# Patient Record
Sex: Female | Born: 1935 | Race: White | Hispanic: No
Health system: Southern US, Community
[De-identification: ages and names within clinical notes are randomized; demographics above are authoritative.]

## PROBLEM LIST (undated history)

## (undated) DIAGNOSIS — C801 Malignant (primary) neoplasm, unspecified: Secondary | ICD-10-CM

## (undated) DIAGNOSIS — I10 Essential (primary) hypertension: Secondary | ICD-10-CM

## (undated) HISTORY — PX: OTHER SURGICAL HISTORY: SHX169

## (undated) HISTORY — PX: CARDIAC CATHETERIZATION: SHX172

---

## 1998-10-10 ENCOUNTER — Ambulatory Visit (HOSPITAL_COMMUNITY): Admission: RE | Admit: 1998-10-10 | Discharge: 1998-10-10 | Payer: Self-pay | Admitting: Cardiology

## 2000-11-19 ENCOUNTER — Other Ambulatory Visit: Admission: RE | Admit: 2000-11-19 | Discharge: 2000-11-19 | Payer: Self-pay | Admitting: Dermatology

## 2000-12-21 ENCOUNTER — Other Ambulatory Visit: Admission: RE | Admit: 2000-12-21 | Discharge: 2000-12-21 | Payer: Self-pay | Admitting: Dermatology

## 2001-11-18 ENCOUNTER — Other Ambulatory Visit: Admission: RE | Admit: 2001-11-18 | Discharge: 2001-11-18 | Payer: Self-pay | Admitting: Dermatology

## 2015-09-23 ENCOUNTER — Encounter (HOSPITAL_COMMUNITY): Payer: Self-pay | Admitting: *Deleted

## 2015-09-23 ENCOUNTER — Emergency Department (HOSPITAL_COMMUNITY): Payer: Medicare Other

## 2015-09-23 ENCOUNTER — Inpatient Hospital Stay (HOSPITAL_COMMUNITY)
Admission: EM | Admit: 2015-09-23 | Discharge: 2015-09-25 | DRG: 308 | Disposition: A | Discharge status: Hospice | Payer: Medicare Other | Attending: Oncology | Admitting: Oncology

## 2015-09-23 ENCOUNTER — Encounter: Payer: Self-pay | Admitting: Cardiology

## 2015-09-23 DIAGNOSIS — R0602 Shortness of breath: Secondary | ICD-10-CM | POA: Diagnosis present

## 2015-09-23 DIAGNOSIS — R0989 Other specified symptoms and signs involving the circulatory and respiratory systems: Secondary | ICD-10-CM

## 2015-09-23 DIAGNOSIS — I5030 Unspecified diastolic (congestive) heart failure: Secondary | ICD-10-CM

## 2015-09-23 DIAGNOSIS — R079 Chest pain, unspecified: Secondary | ICD-10-CM

## 2015-09-23 DIAGNOSIS — I110 Hypertensive heart disease with heart failure: Secondary | ICD-10-CM | POA: Diagnosis not present

## 2015-09-23 DIAGNOSIS — C4491 Basal cell carcinoma of skin, unspecified: Secondary | ICD-10-CM | POA: Diagnosis not present

## 2015-09-23 DIAGNOSIS — C4490 Unspecified malignant neoplasm of skin, unspecified: Secondary | ICD-10-CM | POA: Diagnosis not present

## 2015-09-23 DIAGNOSIS — R9431 Abnormal electrocardiogram [ECG] [EKG]: Secondary | ICD-10-CM | POA: Diagnosis present

## 2015-09-23 DIAGNOSIS — Z7982 Long term (current) use of aspirin: Secondary | ICD-10-CM | POA: Diagnosis not present

## 2015-09-23 DIAGNOSIS — I25119 Atherosclerotic heart disease of native coronary artery with unspecified angina pectoris: Secondary | ICD-10-CM | POA: Diagnosis not present

## 2015-09-23 DIAGNOSIS — I160 Hypertensive urgency: Secondary | ICD-10-CM

## 2015-09-23 DIAGNOSIS — Z66 Do not resuscitate: Secondary | ICD-10-CM | POA: Diagnosis not present

## 2015-09-23 DIAGNOSIS — Z7189 Other specified counseling: Secondary | ICD-10-CM

## 2015-09-23 DIAGNOSIS — I5033 Acute on chronic diastolic (congestive) heart failure: Secondary | ICD-10-CM | POA: Diagnosis not present

## 2015-09-23 DIAGNOSIS — Z91041 Radiographic dye allergy status: Secondary | ICD-10-CM

## 2015-09-23 DIAGNOSIS — Z79899 Other long term (current) drug therapy: Secondary | ICD-10-CM | POA: Diagnosis not present

## 2015-09-23 DIAGNOSIS — Z515 Encounter for palliative care: Secondary | ICD-10-CM | POA: Diagnosis present

## 2015-09-23 DIAGNOSIS — C6962 Malignant neoplasm of left orbit: Secondary | ICD-10-CM | POA: Diagnosis not present

## 2015-09-23 DIAGNOSIS — E785 Hyperlipidemia, unspecified: Secondary | ICD-10-CM | POA: Diagnosis not present

## 2015-09-23 DIAGNOSIS — I249 Acute ischemic heart disease, unspecified: Secondary | ICD-10-CM | POA: Diagnosis present

## 2015-09-23 DIAGNOSIS — M810 Age-related osteoporosis without current pathological fracture: Secondary | ICD-10-CM | POA: Diagnosis not present

## 2015-09-23 DIAGNOSIS — I447 Left bundle-branch block, unspecified: Secondary | ICD-10-CM | POA: Diagnosis not present

## 2015-09-23 DIAGNOSIS — Z87891 Personal history of nicotine dependence: Secondary | ICD-10-CM | POA: Diagnosis not present

## 2015-09-23 DIAGNOSIS — J439 Emphysema, unspecified: Secondary | ICD-10-CM | POA: Diagnosis present

## 2015-09-23 DIAGNOSIS — I213 ST elevation (STEMI) myocardial infarction of unspecified site: Secondary | ICD-10-CM | POA: Diagnosis not present

## 2015-09-23 DIAGNOSIS — Z79810 Long term (current) use of selective estrogen receptor modulators (SERMs): Secondary | ICD-10-CM | POA: Diagnosis not present

## 2015-09-23 DIAGNOSIS — K219 Gastro-esophageal reflux disease without esophagitis: Secondary | ICD-10-CM | POA: Diagnosis present

## 2015-09-23 DIAGNOSIS — R069 Unspecified abnormalities of breathing: Secondary | ICD-10-CM | POA: Diagnosis not present

## 2015-09-23 DIAGNOSIS — I509 Heart failure, unspecified: Secondary | ICD-10-CM

## 2015-09-23 HISTORY — DX: Essential (primary) hypertension: I10

## 2015-09-23 HISTORY — DX: Malignant (primary) neoplasm, unspecified: C80.1

## 2015-09-23 LAB — COMPREHENSIVE METABOLIC PANEL
ALBUMIN: 4 g/dL (ref 3.5–5.0)
ALK PHOS: 66 U/L (ref 38–126)
ALT: 16 U/L (ref 14–54)
ANION GAP: 13 (ref 5–15)
AST: 22 U/L (ref 15–41)
BUN: 10 mg/dL (ref 6–20)
CALCIUM: 9.2 mg/dL (ref 8.9–10.3)
CO2: 22 mmol/L (ref 22–32)
Chloride: 101 mmol/L (ref 101–111)
Creatinine, Ser: 0.87 mg/dL (ref 0.44–1.00)
GFR calc non Af Amer: 57 mL/min — ABNORMAL LOW (ref >60)
GLUCOSE: 157 mg/dL — AB (ref 65–99)
POTASSIUM: 3.9 mmol/L (ref 3.5–5.1)
SODIUM: 136 mmol/L (ref 135–145)
Total Bilirubin: 0.6 mg/dL (ref 0.3–1.2)
Total Protein: 7.9 g/dL (ref 6.5–8.1)

## 2015-09-23 LAB — URINE MICROSCOPIC-ADD ON

## 2015-09-23 LAB — TROPONIN I
TROPONIN I: 0.04 ng/mL — AB (ref <0.03)
Troponin I: 0.03 ng/mL (ref <0.03)
Troponin I: 0.04 ng/mL (ref <0.03)

## 2015-09-23 LAB — URINALYSIS, ROUTINE W REFLEX MICROSCOPIC
BILIRUBIN URINE: NEGATIVE
Glucose, UA: NEGATIVE mg/dL
HGB URINE DIPSTICK: NEGATIVE
Ketones, ur: NEGATIVE mg/dL
Leukocytes, UA: NEGATIVE
NITRITE: NEGATIVE
PROTEIN: 30 mg/dL — AB
SPECIFIC GRAVITY, URINE: 1.014 (ref 1.005–1.030)
pH: 7 (ref 5.0–8.0)

## 2015-09-23 LAB — CBC WITH DIFFERENTIAL/PLATELET
BASOS PCT: 0 %
Basophils Absolute: 0.1 10*3/uL (ref 0.0–0.1)
EOS ABS: 0.2 10*3/uL (ref 0.0–0.7)
EOS PCT: 1 %
HCT: 41.3 % (ref 36.0–46.0)
HEMOGLOBIN: 13.1 g/dL (ref 12.0–15.0)
LYMPHS ABS: 4.5 10*3/uL — AB (ref 0.7–4.0)
Lymphocytes Relative: 25 %
MCH: 25.8 pg — AB (ref 26.0–34.0)
MCHC: 31.7 g/dL (ref 30.0–36.0)
MCV: 81.3 fL (ref 78.0–100.0)
MONO ABS: 1.3 10*3/uL — AB (ref 0.1–1.0)
MONOS PCT: 7 %
NEUTROS PCT: 67 %
Neutro Abs: 11.7 10*3/uL — ABNORMAL HIGH (ref 1.7–7.7)
PLATELETS: 371 10*3/uL (ref 150–400)
RBC: 5.08 MIL/uL (ref 3.87–5.11)
RDW: 15.4 % (ref 11.5–15.5)
WBC: 17.7 10*3/uL — ABNORMAL HIGH (ref 4.0–10.5)

## 2015-09-23 LAB — CBG MONITORING, ED: Glucose-Capillary: 158 mg/dL — ABNORMAL HIGH (ref 65–99)

## 2015-09-23 LAB — MRSA PCR SCREENING: MRSA by PCR: NEGATIVE

## 2015-09-23 LAB — BRAIN NATRIURETIC PEPTIDE: B NATRIURETIC PEPTIDE 5: 482.9 pg/mL — AB (ref 0.0–100.0)

## 2015-09-23 LAB — I-STAT TROPONIN, ED: TROPONIN I, POC: 0.01 ng/mL (ref 0.00–0.08)

## 2015-09-23 MED ORDER — NITROGLYCERIN 0.4 MG SL SUBL: 0.4000 mg | SUBLINGUAL_TABLET | SUBLINGUAL | Status: DC | PRN

## 2015-09-23 MED ORDER — ALPRAZOLAM 0.25 MG PO TABS
0.2500 mg | ORAL_TABLET | Freq: Every evening | ORAL | Status: DC | PRN
Start: 2015-09-23 — End: 2015-09-25
  Administered 2015-09-23: 0.25 mg via ORAL

## 2015-09-23 MED ORDER — ASPIRIN 81 MG PO CHEW
324.0000 mg | CHEWABLE_TABLET | Freq: Once | ORAL | Status: AC
  Administered 2015-09-23: 324 mg via ORAL
  Filled 2015-09-23: qty 4

## 2015-09-23 MED ORDER — ONDANSETRON HCL 4 MG/2ML IJ SOLN
4.0000 mg | Freq: Four times a day (QID) | INTRAMUSCULAR | Status: DC | PRN
  Administered 2015-09-24 – 2015-09-25 (×2): 4 mg via INTRAVENOUS
  Filled 2015-09-23 (×2): qty 2

## 2015-09-23 MED ORDER — HEPARIN BOLUS VIA INFUSION: 4000.0000 [IU] | Freq: Once | INTRAVENOUS | Status: DC

## 2015-09-23 MED ORDER — LABETALOL HCL 5 MG/ML IV SOLN
2.0000 mg/min | INTRAVENOUS | Status: DC
  Administered 2015-09-23: 2 mg/min via INTRAVENOUS
  Filled 2015-09-23: qty 100

## 2015-09-23 MED ORDER — NITROGLYCERIN IN D5W 200-5 MCG/ML-% IV SOLN
0.0000 ug/min | INTRAVENOUS | Status: DC
  Administered 2015-09-23: 50 ug/min via INTRAVENOUS
  Administered 2015-09-24: 60 ug/min via INTRAVENOUS
  Filled 2015-09-23 (×2): qty 250

## 2015-09-23 MED ORDER — HEPARIN (PORCINE) IN NACL 100-0.45 UNIT/ML-% IJ SOLN
700.0000 [IU]/h | INTRAMUSCULAR | Status: DC
  Administered 2015-09-23: 700 [IU]/h via INTRAVENOUS
  Filled 2015-09-23 (×2): qty 250

## 2015-09-23 MED ORDER — DIPHENHYDRAMINE HCL 25 MG PO CAPS
50.0000 mg | ORAL_CAPSULE | Freq: Every day | ORAL | Status: DC
  Administered 2015-09-23 – 2015-09-24 (×2): 50 mg via ORAL
  Filled 2015-09-23 (×2): qty 2

## 2015-09-23 MED ORDER — METOPROLOL TARTRATE 5 MG/5ML IV SOLN
5.0000 mg | Freq: Once | INTRAVENOUS | Status: AC
  Administered 2015-09-23: 5 mg via INTRAVENOUS
  Filled 2015-09-23: qty 5

## 2015-09-23 MED ORDER — ASPIRIN EC 81 MG PO TBEC
81.0000 mg | DELAYED_RELEASE_TABLET | Freq: Every day | ORAL | Status: DC
  Administered 2015-09-24 – 2015-09-25 (×2): 81 mg via ORAL
  Filled 2015-09-23 (×2): qty 1

## 2015-09-23 MED ORDER — PANTOPRAZOLE SODIUM 40 MG PO TBEC
40.0000 mg | DELAYED_RELEASE_TABLET | Freq: Every day | ORAL | Status: DC
  Administered 2015-09-23 – 2015-09-25 (×3): 40 mg via ORAL
  Filled 2015-09-23 (×3): qty 1

## 2015-09-23 MED ORDER — HEPARIN SODIUM (PORCINE) 5000 UNIT/ML IJ SOLN: 4000.0000 [IU] | Freq: Once | INTRAMUSCULAR | Status: DC

## 2015-09-23 MED ORDER — FUROSEMIDE 10 MG/ML IJ SOLN: 40.0000 mg | Freq: Once | INTRAMUSCULAR | Status: DC

## 2015-09-23 MED ORDER — CITALOPRAM HYDROBROMIDE 20 MG PO TABS
20.0000 mg | ORAL_TABLET | Freq: Every day | ORAL | Status: DC
  Administered 2015-09-23 – 2015-09-25 (×3): 20 mg via ORAL
  Filled 2015-09-23 (×3): qty 1

## 2015-09-23 MED ORDER — HEPARIN SODIUM (PORCINE) 5000 UNIT/ML IJ SOLN
INTRAMUSCULAR | Status: AC
  Administered 2015-09-23: 4000 [IU]
  Filled 2015-09-23: qty 1

## 2015-09-23 MED ORDER — ACETAMINOPHEN 325 MG PO TABS
650.0000 mg | ORAL_TABLET | ORAL | Status: DC | PRN
  Administered 2015-09-23 – 2015-09-24 (×4): 650 mg via ORAL
  Filled 2015-09-23 (×5): qty 2

## 2015-09-23 NOTE — H&P (Signed)
Date: 09/23/2015               Patient Name:  Sandra Friedman MRN: [id number]  DOB: Sep 19, 1923 Age / Sex: 82 y.o., female   PCP: Caryl Bis, MD         Medical Service: Internal Medicine Teaching Service         Attending Physician: Dr. Julianne Rice, MD    First Contact: Dr. Holley Raring Pager: [phone]  Second Contact: Dr. Charlott Rakes Pager: [phone]       After Hours (After 5p/  First Contact Pager: [phone]  weekends / holidays): Second Contact Pager: [phone]   Chief Complaint: SOB / Chest pressure  History of Present Illness: Ms. Sandra Friedman is a 82 y.o. female with a h/o of emphysema/asthma, CAD, HTN, osteoporosis who presents with acute onset of SOB and chest pressure. Symptoms began abruptly this morning. She reports that she got out of bed to use the restroom and was SOB, when she got back in bed she began to have significant chest pressure, substernal, non-radiating, denies pain. EMS was called and she presented to the ED w/ EKG significant for STEMI.  Pt endorses h/o similar symptoms 1 yr ago, which resolved spontaneously, and for which she was never evaluated. She also notes chronic SOB and fatigue which she previously attributed to her emphysema. She also endorses a h/o cardiac catheterization many years ago after which she had cardiac arrest. She notes that at that time she was found to be allergic to the contrast dye.  Cardiology was consulted in the ED and catheterization was not recommended d/t high risk. This determination is in line w/ the pt and family's GoC. Pt is DNR w/ comfort priority.  She was also noted to have inoperable cancer of the left eye which has had paroxysmal bleeding. She has some minor discomfort from this eye, but has not been evaluated by her Ophthalmologist is several years.  Meds: Current Facility-Administered Medications  Medication Dose Route Frequency Provider Last Rate Last Dose  . heparin injection 4,000 Units  4,000 Units  Intravenous Once Para March, [place] at 09/23/15 1208   Current Outpatient Prescriptions  Medication Sig Dispense Refill  . aspirin EC 81 MG tablet Take 81 mg by mouth daily.    . citalopram (CELEXA) 20 MG tablet Take 20 mg by mouth daily.    . cloNIDine (CATAPRES) 0.3 MG tablet Take 0.3 mg by mouth 2 (two) times daily.    . diphenhydrAMINE (BENADRYL) 25 mg capsule Take 50 mg by mouth at bedtime.    Marland Kitchen ibuprofen (ADVIL,MOTRIN) 200 MG tablet Take 400 mg by mouth at bedtime.    Marland Kitchen losartan (COZAAR) 100 MG tablet Take 100 mg by mouth daily.    Marland Kitchen omeprazole (PRILOSEC) 20 MG capsule Take 20 mg by mouth 2 (two) times daily.    . raloxifene (EVISTA) 60 MG tablet Take 60 mg by mouth daily.      Allergies: Allergies as of 09/23/2015 - Review Complete 09/23/2015  Allergen Reaction Noted  . Sulfa antibiotics Anaphylaxis 09/23/2015  . Ivp dye [iodinated diagnostic agents] Other (See Comments)   . Penicillins Other (See Comments) 09/23/2015   Past Medical History:  Diagnosis Date  . Cancer [facility])    Left Eye -- deemed inoperable (apparently she refused surgery prior to this point). Has had a history of bleeding in the eye. Was being [facility] at [facility]. But unable to  obtain records from care everywhere.  . Hypertension     Family History: No significant family hx per pt and son/daughter-in-law  Social History: Retired Regulatory affairs officer. Lives with son and daughter-in-law. 27yr h/o 3 cig/day tobacco use, no alcohol, no drugs.  Review of Systems: A complete ROS was negative except as per HPI. Review of Systems  Constitutional: Positive for malaise/fatigue. Negative for chills, diaphoresis, fever and weight loss.  Eyes: Positive for pain (OS), discharge (OS) and redness (OS). Negative for blurred vision.       Pt w/ invasive periorbital mass of left eye, no sight, some pain  Respiratory: Positive for shortness of breath. Negative for cough.   Cardiovascular: Positive  for chest pain. Negative for leg swelling.  Gastrointestinal: Negative for abdominal pain, constipation, diarrhea, nausea and vomiting.  Genitourinary: Negative for dysuria, frequency and urgency.  Musculoskeletal: Negative for falls and myalgias.  Skin: Negative for rash.  Neurological: Negative for dizziness, tremors and headaches.  Endo/Heme/Allergies: Negative for polydipsia.  Psychiatric/Behavioral: The patient is not nervous/anxious.     Physical Exam: Vitals:   09/23/15 1345 09/23/15 1445 09/23/15 1446 09/23/15 1500  BP: 179/83 189/78  191/72  Pulse: 74 76  66  Resp: 19 17  13   Temp:      TempSrc:      SpO2: 99% 100%  100%  Height:   4\' 11"  (1.499 m)    Physical Exam  Constitutional: She is oriented to person, place, and time. She appears well-developed. She is cooperative. No distress.  HENT:  Head: Normocephalic and atraumatic.  Right Ear: Hearing normal.  Left Ear: Hearing normal.  Nose: Nose normal.  Mouth/Throat: Mucous membranes are normal.  Cardiovascular: Normal rate, regular rhythm, S1 normal, S2 normal and intact distal pulses.  Exam reveals no gallop.   No murmur heard. Pulmonary/Chest: She is in respiratory distress (mild). She has no wheezes. She has no rhonchi. She has no rales. She exhibits no tenderness. Breasts are symmetrical.  Abdominal: Soft. Normal appearance and bowel sounds are normal. She exhibits no ascites. There is no hepatosplenomegaly. There is no tenderness. There is no CVA tenderness.  Musculoskeletal: Normal range of motion. She exhibits no edema.  Neurological: She is alert and oriented to person, place, and time. She has normal strength.  Skin: Skin is warm, dry and intact. She is not diaphoretic.  Psychiatric: She has a normal mood and affect. Her speech is normal and behavior is normal.    Labs: CBC:  Recent Labs Lab 09/23/15 1124  WBC 17.7*  NEUTROABS 11.7*  HGB 13.1  HCT 41.3  MCV 81.3  PLT 123456   Basic Metabolic  Panel:  Recent Labs Lab 09/23/15 1124  NA 136  K 3.9  CL 101  CO2 22  GLUCOSE 157*  BUN 10  CREATININE 0.87  CALCIUM 9.2   Cardiac Enzymes:  Recent Labs Lab 09/23/15 1124 09/23/15 1131  TROPONINI 0.03*  --   TROPIPOC  --  0.01   BNP (last 3 results)  Recent Labs  09/23/15 1124  BNP 482.9*   Liver Function Tests:  Recent Labs Lab 09/23/15 1124  AST 22  ALT 16  ALKPHOS 66  BILITOT 0.6  PROT 7.9  ALBUMIN 4.0   Recent Labs Lab 09/23/15 1124  GLUCAP 158*   Urinalysis    Component Value Date/Time   COLORURINE YELLOW 09/23/2015 1210   APPEARANCEUR CLEAR 09/23/2015 1210   LABSPEC 1.014 09/23/2015 1210   PHURINE 7.0 09/23/2015 1210   GLUCOSEU NEGATIVE  09/23/2015 1210   HGBUR NEGATIVE 09/23/2015 1210   BILIRUBINUR NEGATIVE 09/23/2015 1210   KETONESUR NEGATIVE 09/23/2015 1210   PROTEINUR 30 (A) 09/23/2015 1210   NITRITE NEGATIVE 09/23/2015 1210   LEUKOCYTESUR NEGATIVE 09/23/2015 1210   EKG: EKG Interpretation  Date/Time:    Ventricular Rate:    PR Interval:    QRS Duration:   QT Interval:    QTC Calculation:   R Axis:     Text Interpretation:    Imaging: Dg Chest Port 1 View  Result Date: 09/23/2015 CLINICAL DATA:  Shortness of breath. EXAM: PORTABLE CHEST 1 VIEW COMPARISON:  None. FINDINGS: The heart is mildly enlarged. There is no evidence of pulmonary edema, consolidation, pneumothorax, nodule or pleural fluid. Visualized bony structures are unremarkable. IMPRESSION: Mild cardiac enlargement.  No active disease. Electronically Signed   By: Aletta Edouard M.D.   On: 09/23/2015 12:09   Assessment & Plan by Problem: Principal Problem:   Shortness of breath at rest Active Problems:   ST-segment elevation on initial EKG, then converted to LBBB   LBBB (left bundle branch block)   Encounter for discussion of do not resuscitate order   ACS (acute coronary syndrome) [facility])  Ms. CYSTAL GAVAN is a 82 y.o. female with h/o emphysema/asthma, CAD,  HTN, osteoporosis who presents w/ acute onset CP and SOB found to have ST elevation on EKG.  1) CP: Pt w/ h/o CAD. Possible ACS w/ ST elevation on EKG. Trop 0.03. Hypertensive. Refuses cath. GoC comfort only. - admit to stepdown w/ tele - Cardiology c/s: appreciate recs  - 72 hr IV heparin ACS protocol (monitor for bleeding from left eye)  - Nitro gtt  - Echo  - consider IV Lasix, will hold pending echo - trend Trops x 2 (@1730  & 2330) - CBC/BMP in AM - cont ASA - monitor HTN  2) Leukocytosis: possible reactive in setting of ACS. WBC (17.7) w/ temp of 99.77F. SOB w/ CXR w/o evidence of infiltrate. UA w/ few bacteria no other signs of UTI, asymptomatic. - continue to monitor  3) HTN: BP elevated on admission to 170/100. On home meds of losartan 100mg  qD, clonidine 0.3 BID. Received IV labetalol in ED w/ some improvement in BP, but HR down to the 50s. - Nitro gtt - Per Cards, titrate beta blocker plus or minus ARB for blood pressure control while weaning nitro  DVT PPx - heparin  Code Status - DNR (comfort only)  Consults Placed - Cardiology  Dispo: Admit patient to Obs  Signed: Holley Raring, MD 09/23/2015, 2:18 PM  Pager: [phone]

## 2015-09-23 NOTE — Consult Note (Signed)
Interventional Cardiology Consult    Patient ID: Sandra Friedman MRN: [id number], DOB/AGE: September 15, 1923   Admit date: 09/23/2015 Date of Consult: 09/23/2015  Primary Physician: No primary care provider on file. Primary Cardiologist: No prior cardiologist. Last heart catheterization was in the 1980s Requesting Provider: Dr. Lita Mains, [place]  Patient Profile    82 year old woman with a history of hypertension and cancer involving left eye that was deemed inoperable at [facility]. She has not seen her PCP in over 3 years. She presents as a code STEMI evaluation by [facility] after awakening with acute onset dyspnea. EKG from the field showed possible anterior ST elevations with inferior and lateral depressions.  Past Medical History   Past Medical History:  Diagnosis Date  . Cancer [facility])    Left Eye -- deemed inoperable (apparently she refused surgery prior to this point). Has had a history of bleeding in the eye. Was being followed at [facility]. But unable to obtain records from care everywhere.  . Hypertension     Past Surgical History:  Procedure Laterality Date  . CARDIAC CATHETERIZATION  1980s   She barely had 2 catheterizations, one at [facility] and 1 at [facility]. The last catheterization was complicated by contrast and that caused her to "black out". She was told that she has allergy to contrast  . gerd    . hyperlipidemia       Allergies  Allergies  Allergen Reactions  . Sulfa Antibiotics Anaphylaxis  . Ivp Dye [Iodinated Diagnostic Agents]   . Penicillins Other (See Comments)    History of Present Illness    Sandra Friedman is a very pleasant elderly woman who noted being in her relative routine state of health until early this morning at about 8:00 when she awoke with sudden onset shortness of breath with agonal breathing. EMS was contacted and her daughter who is a retired Marine scientist evaluate her as well. She was using rest accessory muscles  upon her evaluation. The patient stated that after being placed on nasal cannula oxygen, her breathing improved significantly. She did note having some substernal chest pressure and heaviness that also was relieved with oxygen. By the time she was being transported she was feeling "a whole lot better ", and is less short of breath now. She appears to be in no distress upon arrival. No longer having any chest pressure. She denies any rapid irregular heartbeats or palpitations. She states that she always feels a little warmth at the site of her cancer that is covered with a patch, this is chronic and nothing new. She denies any antecedent illness such as fevers chills cold sweats. She felt a little bit nauseated this morning but did not vomit. She has not had any blood in her stools or tarry stools or blood in her urine, but has had history of paroxysmal having some bleeding from the eye.  She denies any PND, orthopnea prior to this event but has noted some mild left foot swelling over last couple days  Cardiovascular ROS: positive for - chest pain, edema and shortness of breath negative for - dyspnea on exertion, irregular heartbeat, loss of consciousness, murmur, orthopnea, palpitations, paroxysmal nocturnal dyspnea, rapid heart rate or TIA/amaurosis fugax, syncope/near syncope   Inpatient Medications    . heparin  4,000 Units Intravenous Once    Family History    Family History  Problem Relation Age of Onset  . Family history unknown: Yes  - The patient and  her daughter Daughter were unable to provide any significant family history.  Social History    Social History   Social History  . Marital status: Married    Spouse name: N/A  . Number of children: N/A  . Years of education: N/A   Occupational History  . Not on file.   Social History Main Topics  . Smoking status: Unknown If Ever Smoked  . Smokeless tobacco: Not on file  . Alcohol use No  . Drug use: No  . Sexual activity:  Not on file   Other Topics Concern  . Not on file   Social History Narrative  . No narrative on file     Review of Systems    General:  No chills, fever, night sweats or weight changes.  Cardiovascular:  + chest pressure & new onset dyspnea this AM (not before), No prior dyspnea on exertion, edema, orthopnea, palpitations, paroxysmal nocturnal dyspnea. Dermatological: No rash, lesions/masses;; Cancer involving L eye - wears agauze patch. Large scab lesion on R forehead Respiratory: No cough, dyspnea Urologic: No hematuria, dysuria Abdominal:   No nausea, vomiting, diarrhea, bright red blood per rectum, melena, or hematemesis Neurologic:  No visual changes, wkns, changes in mental status. All other systems reviewed and are otherwise negative except as noted above.  Physical Exam    Blood pressure (!) 231/95, pulse 85, temperature 99.2 F (37.3 C), temperature source Oral, resp. rate 17, SpO2 100 %.  General: Pleasant,Normal mood and affect. Actually appears to be in no acute distress. States that her breathing is much improved. Psych: Normal affect. A little bit tearful because she is in the hospital, but is in sound mind Neuro: Alert and oriented X 3. Moves all extremities spontaneously.; Unable to assess left eye, but right eye EOMI. Otherwise CN II-XII grossly intact HEENT: Very poor dentition. Cancer involving L eye - wears agauze patch - has mild crusted plaque coming down from below the patch on the left nose.. Large scab lesion on R forehead Neck: Supple without bruits; mildly elevated JVD with mild HJR Lungs:  Resp regular and unlabored, CTAB with may be fine bibasilar crackles. Heart: Borderline tachycardia. Normal S1 with split S2. No obvious murmurs, rubs or gallops. Abdomen: Soft, non-tender, non-distended, BS + x 4. (Last BM was this morning) Extremities: No clubbing, cyanosis or edema. DP/PT/Radials 2+ and equal bilaterally.  Labs    Troponin [facility]  Test)  Recent Labs  09/23/15 1131  TROPIPOC 0.01    Recent Labs  09/23/15 1124  TROPONINI 0.03*   Lab Results  Component Value Date   WBC 17.7 (H) 09/23/2015   HGB 13.1 09/23/2015   HCT 41.3 09/23/2015   MCV 81.3 09/23/2015   PLT 371 09/23/2015     Recent Labs Lab 09/23/15 1124  NA 136  K 3.9  CL 101  CO2 22  BUN 10  CREATININE 0.87  CALCIUM 9.2  PROT 7.9  BILITOT 0.6  ALKPHOS 66  ALT 16  AST 22  GLUCOSE 157*   No results found for: CHOL, HDL, LDLCALC, TRIG No results found for: [facility]   Radiology Studies    Dg Chest Port 1 View  Result Date: 09/23/2015 CLINICAL DATA:  Shortness of breath. EXAM: PORTABLE CHEST 1 VIEW COMPARISON:  None. FINDINGS: The heart is mildly enlarged. There is no evidence of pulmonary edema, consolidation, pneumothorax, nodule or pleural fluid. Visualized bony structures are unremarkable. IMPRESSION: Mild cardiac enlargement.  No active disease. Electronically Signed  By: Aletta Edouard M.D.   On: 09/23/2015 12:09    ECG & Cardiac Imaging     EKG by EMS shows sinus rhythm heart rate 93 BPM. Undetermined rhythm, but appears to be sinus rhythm. There is right axis deviation of 121. Nonspecific IVCD with concerning ST elevations in leads V1-V3 with ST depressions in II, III, aVF as well as V6. His also mild ST elevation in aVR and aVL with Q waves in I and aVL.  EKG upon arrival to [facility]: What appears to be Sinus rhythm (likely 1 AVB), heart rate 95 bpm, QRS complexes now appeared to be left bundle branch block with either a barely conducted PACs versus PVCs. There are persistent ST depressions in II and ST elevation in aVR. Q waves no longer noted in I and aVL. Difficult to determine, but does not appear to meet [place]'s criteria for ST elevation in anterior leads.  Assessment & Plan    Principal Problem:   Shortness of breath at rest Active Problems:   ST-segment elevation on initial EKG, then converted to LBBB   LBBB  (left bundle branch block)   Encounter for discussion of do not resuscitate order   ACS (acute coronary syndrome) ([place])  I have seen and evaluated the patient in the emergency room. She is a very pleasant elderly woman who has inoperable cancer, and currently actually "feels well ". Her breathing is notably improved. Difficult to determine what has actually occurred, my suspicion based on the change in EKG is that this very well could be acute coronary syndrome related with what looked like possible anterior STEMI converting to Left Bundle Branch Block. This would be concerning for possible proximal CAD. In fact her out of hospital EKG would've suggested potential left main disease. She actually looks pretty good currently from a clinical standpoint, and is tolerating being profoundly hypertensive and somewhat tachycardic.  I had a long discussion with the patient and then her daughter in the emergency room. We discussed potential treatment options and evaluation options including acute transport to the cardiac catheterization lab for cardiac catheterization and possible PCI as well as primarily early noninvasive medical management with heparin hypertension control with nitrates and beta blocker as well as likely diuretic.  I provided her the risks, benefits and potential complications of both options as well as discussed concern for potential cardiac arrest in the setting of potential MI. After about a five-minute discussion the patient made it clear that she would not want to have a heart catheterization for several reasons: One being that she had previously had an near anaphylactic reaction in the 1980s from heart catheterization, the other reason being that she would not want invasive evaluation secondary to her advanced age, inoperable cancer, and previously documented advanced directive. She indicates that she would not want CPR or intubation were she to have cardiac arrest. Based on this  conversation, the patient is indicated that she would prefer to have medical management of her potential acute coronary syndrome without going to the cardiac catheterization lab.  Code STEMI was canceled. DO NOT RESUSCITATE/DO NOT INTUBATE order placed in the chart In the ER she was administered 4000 units of IV heparin, and we will plan IV heparin drip She was given IV beta blocker for blood pressure/heart rate.  When I completed my evaluation she is currently getting her chest x-ray performed and her initial CBC came back with an elevated white blood cell count. She is also noted to have a  mild low-grade fever of unsure importance.  After discussing with Dr. Lita Mains, we decided that the best course of action given her somewhat compensated history would be to ask [facility] hospitalist team to admit the patient with cardiology consultation. I have notified the general cardiology M.D. on call, Dr. Candee Furbish will be aware of the patient -however this counsel note will serve for initial cardiology consultation.   Recommendations:  72 hour IV heparin monitoring for any signs of bleeding especially from her eye.  Likely IV nitroglycerin infusion depending on her blood pressure looks like after the IV beta blocker.  IV Lasix pending further evaluation with chest x-ray and echocardiogram  Check 2-D echocardiogram  Titrate beta blocker plus or minus ARB for blood pressure control while weaning off nitroglycerin  Follow-up chest x-ray to ensure that her low-grade fever and white blood cell count is not related to potential pneumonia given her presentation with dyspnea.  Complete medical evaluation for other potential etiologies for her white count and fever. This could simply be related to ACS.  Would consider palliative care consultation if not simply just to discuss patient's goals of care.     Signed, Glenetta Hew, M.D., M.S. Interventional Cardiologist   Pager # [phone] Phone #  [phone] [address] [place], [place] [address]

## 2015-09-23 NOTE — ED Notes (Signed)
Report called and accepted

## 2015-09-23 NOTE — ED Notes (Signed)
Per Dr. Posey Pronto, we are holding off on the lasix for now until he puts in his final admitting orders

## 2015-09-23 NOTE — ED Notes (Signed)
Collected troponin and dropped off in the lab

## 2015-09-23 NOTE — ED Notes (Signed)
Cardiologist Selena Batten at bedside discussing options with pt

## 2015-09-23 NOTE — Progress Notes (Signed)
ANTICOAGULATION CONSULT NOTE - Initial Consult  Pharmacy Consult for heparin Indication: chest pain/ACS  Allergies  Allergen Reactions  . Sulfa Antibiotics Anaphylaxis  . Ivp Dye [Iodinated Diagnostic Agents] Other (See Comments)    Unknown  . Penicillins Other (See Comments)    Unknown    Patient Measurements: Height: 4\' 11"  (149.9 cm) IBW/kg (Calculated) : 43.2  Vital Signs: Temp: 99.2 F (37.3 C) (08/20 1129) Temp Source: Oral (08/20 1129) BP: 179/83 (08/20 1345) Pulse Rate: 74 (08/20 1345)  Labs:  Recent Labs  09/23/15 1124  HGB 13.1  HCT 41.3  PLT 371  CREATININE 0.87  TROPONINI 0.03*    CrCl cannot be calculated (Unknown ideal weight.).   Medical History: Past Medical History:  Diagnosis Date  . Cancer [facility])    Left Eye -- deemed inoperable (apparently she refused surgery prior to this point). Has had a history of bleeding in the eye. Was being followed at [facility]. But unable to obtain records from care everywhere.  . Hypertension     Medications:  Infusions:  . heparin    . nitroGLYCERIN      Assessment: 89 yof presented to the ED as a code stemi which was ultimately cancelled. She received a dose of IV heparin earlier today upon ED arrival, now starting a heparin gtt. Baseline H/H and platelets are WNL. She is not on anticoagulation PTA.   Goal of Therapy:  Heparin level 0.3-0.7 units/ml Monitor platelets by anticoagulation protocol: Yes   Plan:  - Heparin gtt 700 units/hr - Check an 8 hr heparin level - Daily heparin level and CBC  Sandra Friedman, Rande Lawman 09/23/2015,3:01 PM

## 2015-09-23 NOTE — ED Notes (Signed)
Attempted report to 2H 

## 2015-09-23 NOTE — ED Notes (Signed)
Code STEMI cancelled per cardiologist Ellyn Hack

## 2015-09-23 NOTE — ED Triage Notes (Signed)
Pt in from home c/o SOB onset this am, [facility] arrived to the home & administered O2 [place] & SOB subsided, pt c/o intermittent CP, pt reported to have ST elevation in leads 3 & 4, pt denies SOB, & CP upon arrival to ED, A&O x4

## 2015-09-23 NOTE — ED Provider Notes (Signed)
[facility] DEPT Provider Note   CSN: [id number] Arrival date & time: 09/23/15  1116     History   Chief Complaint Chief Complaint  Patient presents with  . Code STEMI    HPI Sandra Friedman is a 82 y.o. female.  HPI Patient presents with acute onset shortness of breath and mild chest pressure starting this morning. No fever or chills. No cough. Left bundle branch block with some concerning findings for ischemia on EKG done by EMS. Acute STEMI initiated. Patient states that her chest pressure is now completely resolved. She states her shortness of breath is improved with oxygen. She has had some lower extremity swelling but denies any pain. Patient has not followed regularly by a physician. She has an invasive skin cancer near the left eye per family. She's elected not to have treatment.  Past Medical History:  Diagnosis Date  . Cancer [facility])    Left Eye -- deemed inoperable (apparently she refused surgery prior to this point). Has had a history of bleeding in the eye. Was being followed at [facility]. But unable to obtain records from care everywhere.  . Hypertension     Patient Active Problem List   Diagnosis Date Noted  . ST-segment elevation on initial EKG, then converted to LBBB 09/23/2015    Class: Hospitalized for  . LBBB (left bundle branch block) 09/23/2015  . Shortness of breath at rest 09/23/2015  . Encounter for discussion of do not resuscitate order 09/23/2015  . ACS (acute coronary syndrome) ([place]) 09/23/2015    Past Surgical History:  Procedure Laterality Date  . CARDIAC CATHETERIZATION  1980s   She barely had 2 catheterizations, one at [facility] and 1 at [facility]. The last catheterization was complicated by contrast and that caused her to "black out". She was told that she has allergy to contrast  . gerd    . hyperlipidemia      OB History    No data available       Home Medications    Prior to Admission medications     Medication Sig Start Date End Date Taking? Authorizing Provider  aspirin EC 81 MG tablet Take 81 mg by mouth daily.   Yes Historical Provider, MD  citalopram (CELEXA) 20 MG tablet Take 20 mg by mouth daily.   Yes Historical Provider, MD  cloNIDine (CATAPRES) 0.3 MG tablet Take 0.3 mg by mouth 2 (two) times daily.   Yes Historical Provider, MD  diphenhydrAMINE (BENADRYL) 25 mg capsule Take 50 mg by mouth at bedtime.   Yes Historical Provider, MD  ibuprofen (ADVIL,MOTRIN) 200 MG tablet Take 400 mg by mouth at bedtime.   Yes Historical Provider, MD  losartan (COZAAR) 100 MG tablet Take 100 mg by mouth daily.   Yes Historical Provider, MD  omeprazole (PRILOSEC) 20 MG capsule Take 20 mg by mouth 2 (two) times daily.   Yes Historical Provider, MD  raloxifene (EVISTA) 60 MG tablet Take 60 mg by mouth daily.   Yes Historical Provider, MD    Family History Family History  Problem Relation Age of Onset  . Family history unknown: Yes    Social History Social History  Substance Use Topics  . Smoking status: Unknown If Ever Smoked  . Smokeless tobacco: Not on file  . Alcohol use No     Allergies   Sulfa antibiotics; Ivp dye [iodinated diagnostic agents]; and Penicillins   Review of Systems Review of Systems  Constitutional: Negative for chills,  fatigue and fever.  HENT: Negative for facial swelling.   Respiratory: Positive for chest tightness and shortness of breath. Negative for cough and wheezing.   Cardiovascular: Positive for leg swelling. Negative for chest pain and palpitations.  Gastrointestinal: Negative for abdominal pain, diarrhea, nausea and vomiting.  Musculoskeletal: Negative for neck pain and neck stiffness.  Skin: Negative for rash and wound.  Neurological: Negative for dizziness, syncope, weakness, numbness and headaches.  All other systems reviewed and are negative.    Physical Exam Updated Vital Signs BP 191/72   Pulse 66   Temp 99.2 F (37.3 C) (Oral)   Resp  13   Ht 4\' 11"  (1.499 m)   SpO2 100%   Physical Exam  Constitutional: She is oriented to person, place, and time. She appears well-developed and well-nourished.  HENT:  Head: Normocephalic and atraumatic.  Mouth/Throat: Oropharynx is clear and moist.  Dressing in place over left eye  Eyes: Conjunctivae and EOM are normal. Pupils are equal, round, and reactive to light.  Neck: Normal range of motion. Neck supple. No JVD present.  Cardiovascular: Regular rhythm.  Exam reveals no gallop and no friction rub.   No murmur heard. Tachycardia  Pulmonary/Chest: Effort normal and breath sounds normal.  Decreased breath sounds bilateral bases.   Abdominal: Soft. Bowel sounds are normal. There is no tenderness. There is no rebound and no guarding.  Musculoskeletal: Normal range of motion. She exhibits edema. She exhibits no tenderness.  1+ bilateral lower extremity edema. 2+ dorsalis pedis pulses.  Neurological: She is alert and oriented to person, place, and time.  Moving all extremities without focal deficit. Sensation is grossly intact.  Skin: Skin is warm and dry. Capillary refill takes less than 2 seconds. No rash noted. No erythema.  Psychiatric: She has a normal mood and affect. Her behavior is normal.  Nursing note and vitals reviewed.    ED Treatments / Results  Labs (all labs ordered are listed, but only abnormal results are displayed) Labs Reviewed  CBC WITH DIFFERENTIAL/PLATELET - Abnormal; Notable for the following:       Result Value   WBC 17.7 (*)    MCH 25.8 (*)    Neutro Abs 11.7 (*)    Lymphs Abs 4.5 (*)    Monocytes Absolute 1.3 (*)    All other components within normal limits  COMPREHENSIVE METABOLIC PANEL - Abnormal; Notable for the following:    Glucose, Bld 157 (*)    GFR calc non Af Amer 57 (*)    All other components within normal limits  TROPONIN I - Abnormal; Notable for the following:    Troponin I 0.03 (*)    All other components within normal limits    BRAIN NATRIURETIC PEPTIDE - Abnormal; Notable for the following:    B Natriuretic Peptide 482.9 (*)    All other components within normal limits  URINALYSIS, ROUTINE W REFLEX MICROSCOPIC (NOT AT [facility]) - Abnormal; Notable for the following:    Protein, ur 30 (*)    All other components within normal limits  URINE MICROSCOPIC-ADD ON - Abnormal; Notable for the following:    Squamous Epithelial / LPF 0-5 (*)    Bacteria, UA RARE (*)    All other components within normal limits  CBG MONITORING, ED - Abnormal; Notable for the following:    Glucose-Capillary 158 (*)    All other components within normal limits  MRSA PCR SCREENING  TROPONIN I  TROPONIN I  TROPONIN I  HEPARIN LEVEL (  UNFRACTIONATED)  Randolm Idol, ED    EKG  EKG Interpretation  Date/Time:  Sunday September 23 2015 11:32:44 EDT Ventricular Rate:  91 PR Interval:    QRS Duration: 139 QT Interval:  399 QTC Calculation: 491 R Axis:   -22 Text Interpretation:  Sinus or ectopic atrial rhythm Atrial premature complex Left bundle branch block Confirmed by Lita Mains  MD, Enez Monahan ([id number]) on 09/23/2015 4:07:12 PM       Radiology Dg Chest Port 1 View  Result Date: 09/23/2015 CLINICAL DATA:  Shortness of breath. EXAM: PORTABLE CHEST 1 VIEW COMPARISON:  None. FINDINGS: The heart is mildly enlarged. There is no evidence of pulmonary edema, consolidation, pneumothorax, nodule or pleural fluid. Visualized bony structures are unremarkable. IMPRESSION: Mild cardiac enlargement.  No active disease. Electronically Signed   By: Aletta Edouard M.D.   On: 09/23/2015 12:09    Procedures Procedures (including critical care time)  Medications Ordered in ED Medications  aspirin EC tablet 81 mg (not administered)  acetaminophen (TYLENOL) tablet 650 mg (not administered)  ondansetron (ZOFRAN) injection 4 mg (not administered)  nitroGLYCERIN 50 mg in dextrose 5 % 250 mL (0.2 mg/mL) infusion (50 mcg/min Intravenous New Bag/Given 09/23/15  1559)  citalopram (CELEXA) tablet 20 mg (not administered)  diphenhydrAMINE (BENADRYL) capsule 50 mg (not administered)  pantoprazole (PROTONIX) EC tablet 40 mg (not administered)  heparin ADULT infusion 100 units/mL (25000 units/236mL sodium chloride 0.45%) (700 Units/hr Intravenous New Bag/Given 09/23/15 1602)  heparin 5000 UNIT/ML injection (4,000 Units  Given 09/23/15 1129)  aspirin chewable tablet 324 mg (324 mg Oral Given 09/23/15 1155)  metoprolol (LOPRESSOR) injection 5 mg (5 mg Intravenous Given 09/23/15 1215)   CRITICAL CARE Performed by: Lita Mains, [place] Sanz Total critical care time: 35 minutes Critical care time was exclusive of separately billable procedures and treating other patients. Critical care was necessary to treat or prevent imminent or life-threatening deterioration. Critical care was time spent personally by me on the following activities: development of treatment plan with patient and/or surrogate as well as nursing, discussions with consultants, evaluation of patient's response to treatment, examination of patient, obtaining history from patient or surrogate, ordering and performing treatments and interventions, ordering and review of laboratory studies, ordering and review of radiographic studies, pulse oximetry and re-evaluation of patient's condition.   Initial Impression / Assessment and Plan / ED Course  I have reviewed the triage vital signs and the nursing notes.  Pertinent labs & imaging results that were available during my care of the patient were reviewed by me and considered in my medical decision making (see chart for details).  Clinical Course   Dr Ellyn Hack had a long conversation with patient. She does not want to have cardiac catheterization or chest compressions/intubation. She wants to be treated medically. Code STEMI was canceled. Patient noted to be very hypertensive in the emergency department. Unresponsive to initial dose of metoprolol. Started on  labetalol drip. Continues to be chest pain-free. Discussed with internal medicine service and will admit to step down bed. Final Clinical Impressions(s) / ED Diagnoses   Final diagnoses:  Acute on chronic congestive heart failure, unspecified congestive heart failure type ([place])  Hypertensive urgency  Chest pain, unspecified chest pain type  LBBB (left bundle branch block)    New Prescriptions Current Discharge Medication List       Julianne Rice, MD 09/23/15 [phone]

## 2015-09-24 ENCOUNTER — Inpatient Hospital Stay (HOSPITAL_COMMUNITY): Payer: Medicare Other

## 2015-09-24 DIAGNOSIS — R079 Chest pain, unspecified: Secondary | ICD-10-CM

## 2015-09-24 DIAGNOSIS — I110 Hypertensive heart disease with heart failure: Secondary | ICD-10-CM

## 2015-09-24 DIAGNOSIS — I5030 Unspecified diastolic (congestive) heart failure: Secondary | ICD-10-CM

## 2015-09-24 LAB — BASIC METABOLIC PANEL WITH GFR
Anion gap: 12 (ref 5–15)
BUN: 14 mg/dL (ref 6–20)
CO2: 22 mmol/L (ref 22–32)
Calcium: 8.7 mg/dL — ABNORMAL LOW (ref 8.9–10.3)
Chloride: 100 mmol/L — ABNORMAL LOW (ref 101–111)
Creatinine, Ser: 0.95 mg/dL (ref 0.44–1.00)
GFR calc Af Amer: 59 mL/min — ABNORMAL LOW
GFR calc non Af Amer: 51 mL/min — ABNORMAL LOW
Glucose, Bld: 118 mg/dL — ABNORMAL HIGH (ref 65–99)
Potassium: 3.9 mmol/L (ref 3.5–5.1)
Sodium: 134 mmol/L — ABNORMAL LOW (ref 135–145)

## 2015-09-24 LAB — ECHOCARDIOGRAM COMPLETE
Height: 59 in
WEIGHTICAEL: 2038.81 [oz_av]

## 2015-09-24 LAB — CBC
HCT: 36.2 % (ref 36.0–46.0)
HEMOGLOBIN: 11.4 g/dL — AB (ref 12.0–15.0)
MCH: 25.7 pg — AB (ref 26.0–34.0)
MCHC: 31.5 g/dL (ref 30.0–36.0)
MCV: 81.7 fL (ref 78.0–100.0)
PLATELETS: 332 10*3/uL (ref 150–400)
RBC: 4.43 MIL/uL (ref 3.87–5.11)
RDW: 15.5 % (ref 11.5–15.5)
WBC: 15.9 10*3/uL — ABNORMAL HIGH (ref 4.0–10.5)

## 2015-09-24 LAB — TROPONIN I: Troponin I: 0.04 ng/mL (ref <0.03)

## 2015-09-24 LAB — HEPARIN LEVEL (UNFRACTIONATED)

## 2015-09-24 MED ORDER — LOSARTAN POTASSIUM 25 MG PO TABS
25.0000 mg | ORAL_TABLET | Freq: Every day | ORAL | Status: DC
  Administered 2015-09-24: 25 mg via ORAL
  Filled 2015-09-24: qty 1

## 2015-09-24 MED ORDER — HYDROMORPHONE HCL 1 MG/ML IJ SOLN
0.2000 mg | INTRAMUSCULAR | Status: DC | PRN
  Administered 2015-09-24 (×3): 0.2 mg via INTRAVENOUS
  Filled 2015-09-24 (×3): qty 1

## 2015-09-24 MED ORDER — HYDROMORPHONE HCL 2 MG PO TABS: 1.0000 mg | ORAL_TABLET | Freq: Four times a day (QID) | ORAL | Status: DC | PRN

## 2015-09-24 MED ORDER — CARVEDILOL 3.125 MG PO TABS
3.1250 mg | ORAL_TABLET | Freq: Two times a day (BID) | ORAL | Status: DC
  Administered 2015-09-24: 3.125 mg via ORAL
  Filled 2015-09-24: qty 1

## 2015-09-24 MED ORDER — CARVEDILOL 3.125 MG PO TABS: 3.1250 mg | ORAL_TABLET | Freq: Two times a day (BID) | ORAL | 0 refills | Status: DC

## 2015-09-24 MED ORDER — LABETALOL HCL 5 MG/ML IV SOLN
5.0000 mg | INTRAVENOUS | Status: DC | PRN
  Administered 2015-09-24 (×3): 5 mg via INTRAVENOUS
  Filled 2015-09-24 (×3): qty 4

## 2015-09-24 MED ORDER — LOSARTAN POTASSIUM 50 MG PO TABS: 50.0000 mg | ORAL_TABLET | Freq: Every day | ORAL | Status: DC

## 2015-09-24 MED ORDER — HYDRALAZINE HCL 20 MG/ML IJ SOLN
5.0000 mg | Freq: Once | INTRAMUSCULAR | Status: AC
  Administered 2015-09-24: 5 mg via INTRAVENOUS
  Filled 2015-09-24: qty 1

## 2015-09-24 MED ORDER — CARVEDILOL 6.25 MG PO TABS
6.2500 mg | ORAL_TABLET | Freq: Two times a day (BID) | ORAL | Status: DC
  Administered 2015-09-24 – 2015-09-25 (×2): 6.25 mg via ORAL
  Filled 2015-09-24 (×2): qty 1

## 2015-09-24 MED ORDER — HYDROMORPHONE HCL 2 MG PO TABS: 1.0000 mg | ORAL_TABLET | ORAL | 0 refills | Status: AC | PRN

## 2015-09-24 MED ORDER — NITROGLYCERIN 0.4 MG/SPRAY TL SOLN
1.0000 | Status: DC | PRN
  Filled 2015-09-24: qty 4.9

## 2015-09-24 NOTE — Progress Notes (Signed)
Echocardiogram 2D Echocardiogram has been performed.  Sandra Friedman 09/24/2015, 11:19 AM

## 2015-09-24 NOTE — Progress Notes (Signed)
Patient Name: Sandra Friedman Date of Encounter: 09/24/2015  Hospital Problem List     Principal Problem:   Accelerated hypertension with diastolic congestive heart failure, NYHA class 3 (HCC) Active Problems:   ST-segment elevation on initial EKG, then converted to LBBB   LBBB (left bundle branch block)   Encounter for discussion of do not resuscitate order   ACS (acute coronary syndrome) (HCC)   Shortness of breath at rest    Patient Summary     82 y/o woman without significant prior history admitted for acute onset dyspnea & potential STEMI with ~ New LBBB. R/o for MI. Accelerated HTN noted. Made DNR/DNI & no Cath on admission.  Subjective   Feels better today, just has HA with NTG Heparin gtt d/c'd 2/2 bleeding from eye Cancer & negative Troponin.  Inpatient Medications    . aspirin EC  81 mg Oral Daily  . carvedilol  3.125 mg Oral BID WC  . citalopram  20 mg Oral Daily  . diphenhydrAMINE  50 mg Oral QHS  . losartan  25 mg Oral Daily  . pantoprazole  40 mg Oral Daily    Vital Signs    Vitals:   09/23/15 April 22, 1923 09/23/15 2359 09/24/15 0000 09/24/15 0323  BP: (!) 171/62 (!) 172/60  (!) 174/72  Pulse: 64 78  74  Resp: 19 20  15   Temp: 98.8 F (37.1 C) 98.8 F (37.1 C)  98.3 F (36.8 C)  TempSrc: Oral Oral  Oral  SpO2: 100% 100%  100%  Weight:   130 lb (59 kg) 127 lb 6.8 oz (57.8 kg)  Height:        Intake/Output Summary (Last 24 hours) at 09/24/15 0824 Last data filed at 09/24/15 0600  Gross per 24 hour  Intake            60.75 ml  Output              425 ml  Net          -364.25 ml   Filed Weights   09/24/15 0000 09/24/15 0323  Weight: 130 lb (59 kg) 127 lb 6.8 oz (57.8 kg)    Physical Exam    General: Pleasant,Normal mood and affect. Actually appears to be in no acute distress. States that her breathing is much improved. Psych: Normal affect. wants to know when NTG can stop - has HA Neuro: Alert and oriented X 3. Moves all extremities  spontaneously.; Unable to assess left eye, but right eye EOMI. Otherwise CN II-XII grossly intact HEENT: Very poor dentition. Cancer involving L eye - wears agauze patch - has mild crusted plaque coming down from below the patch on the left nose.. Large scab lesion on R forehead Neck: Supple without bruits; mildly elevated JVD with mild HJR Lungs:  Resp regular and unlabored, CTAB with may be fine bibasilar crackles. Heart: RRR with ectopy. Normal S1 with split S2. No obvious murmurs, rubs or gallops. Abdomen: Soft, non-tender, non-distended, BS + x 4. (Last BM was yesterday) Extremities: No clubbing, cyanosis or edema. DP/PT/Radials 2+ and equal bilaterally.   Labs    CBC  Recent Labs  09/23/15 1124 09/24/15 0111  WBC 17.7* 15.9*  NEUTROABS 11.7*  --   HGB 13.1 11.4*  HCT 41.3 36.2  MCV 81.3 81.7  PLT 371 [id number]   Basic Metabolic Panel  Recent Labs  09/23/15 1124 09/24/15 0111  NA 136 134*  K 3.9 3.9  CL 101 100*  CO2  22 22  GLUCOSE 157* 118*  BUN 10 14  CREATININE 0.87 0.95  CALCIUM 9.2 8.7*   Liver Function Tests  Recent Labs  09/23/15 1124  AST 22  ALT 16  ALKPHOS 66  BILITOT 0.6  PROT 7.9  ALBUMIN 4.0   No results for input(s): LIPASE, AMYLASE in the last 72 hours. Cardiac Enzymes  Recent Labs  09/23/15 1619 09/23/15 1902 09/24/15 0111  TROPONINI 0.04* 0.04* 0.04*   BNP Invalid input(s): POCBNP D-Dimer No results for input(s): DDIMER in the last 72 hours. Hemoglobin A1C No results for input(s): HGBA1C in the last 72 hours. Fasting Lipid Panel No results for input(s): CHOL, HDL, LDLCALC, TRIG, CHOLHDL, LDLDIRECT in the last 72 hours. Thyroid Function Tests No results for input(s): TSH, T4TOTAL, T3FREE, THYROIDAB in the last 72 hours.  Invalid input(s): FREET3  Telemetry    NSR with PVCs, LBBB  ECG    SR 82, 1st Deg AVB, LBBB, PVCs  Radiology    Dg Chest Port 1 View  Result Date: 09/23/2015 CLINICAL DATA:  Shortness of breath.  EXAM: PORTABLE CHEST 1 VIEW COMPARISON:  None. FINDINGS: The heart is mildly enlarged. There is no evidence of pulmonary edema, consolidation, pneumothorax, nodule or pleural fluid. Visualized bony structures are unremarkable. IMPRESSION: Mild cardiac enlargement.  No active disease. Electronically Signed   By: Aletta Edouard M.D.   On: 09/23/2015 12:09    Assessment & Plan    Principal Problem:   Accelerated hypertension with diastolic congestive heart failure, NYHA class 3 ([place]) --Echo pending  BP improved with IV NTG - need to add PO meds -> start Coreg 3.125 & Losartan 25 mg   Once PO meds would start to wean NTG gtt   Active Problems:   ST-segment elevation on initial EKG, then converted toLBBB (left bundle branch block) - not c/w STEMI given negative Troponins  PVCs still present on EKG - will start BB      Encounter for discussion of do not resuscitate order -- DNR/DNI order placed.    ACS (acute coronary syndrome) ([place]) - essentially r/o for MI.  Heparin gtt d/c'd after 3rd Trop.   Shortness of breath at rest - related to Accelerated HTN & CHF; improved with BP control (CXR without sign of pulmonary edema) -- hold off on IV diuretic for now, but may need low dose PO for d/c  Would keep in TCU until able to wean off NTG gtt  Defer Rx of Leukocytosis & low grade Fever to Medicine team. With concern re: bleeding from eye Cancer, would use SCDs for DVT prophylaxis now that Heparin gtt off.  Signed, Glenetta Hew, M.D., M.S. Interventional Cardiologist   Pager # [phone] Phone # [phone] [address] [place], [place] [address]

## 2015-09-24 NOTE — Progress Notes (Signed)
Paged MD after patient reported chest pain and SOB. EKG completed which resembles AM EKG done today. Patient states that chest pain is still present but trending down. Will wait for call back for further orders. Ruben Gottron, [place] 09/24/2015 2113

## 2015-09-24 NOTE — Progress Notes (Addendum)
Subjective: Currently, the patient is comfortable. She has no pain currently but does report some CP earlier in the morning w/ relief using 0.2 mg dilaudid.  Interval Events: Trops negative. Echo 55-60% EF.  Objective: Vital signs in last 24 hours: Vitals:   09/23/15 1924/01[birth year] 09/23/15 2359 09/24/15 0000 09/24/15 0323  BP: (!) 171/62 (!) 172/60  (!) 174/72  Pulse: 64 78  74  Resp: 19 20  15   Temp: 98.8 F (37.1 C) 98.8 F (37.1 C)  98.3 F (36.8 C)  TempSrc: Oral Oral  Oral  SpO2: 100% 100%  100%  Weight:   130 lb (59 kg) 127 lb 6.8 oz (57.8 kg)  Height:       Intake/Output:  08/20 0701 - 08/21 0700 In: 60.8 [P.O.:60; I.V.:0.8] Out: 425 [Urine:425]    Physical Exam: Physical Exam  Constitutional: She appears well-developed. She is cooperative. No distress.  Eyes:  Infiltrating lesion of the left eye, minimal bleeding.  Cardiovascular: Normal rate, regular rhythm, normal heart sounds and normal pulses.  Exam reveals no gallop.   No murmur heard. Pulmonary/Chest: Effort normal and breath sounds normal. No respiratory distress. Breasts are symmetrical.  Abdominal: Soft. Bowel sounds are normal. There is no tenderness.  Musculoskeletal: She exhibits no edema.   Labs: CBC:  Recent Labs Lab 09/23/15 1124 09/24/15 0111  WBC 17.7* 15.9*  NEUTROABS 11.7*  --   HGB 13.1 11.4*  HCT 41.3 36.2  MCV 81.3 81.7  PLT [id number]   Metabolic Panel:  Recent Labs Lab 09/23/15 1124 09/24/15 0111  NA 136 134*  K 3.9 3.9  CL 101 100*  CO2 22 22  GLUCOSE 157* 118*  BUN 10 14  CREATININE 0.87 0.95  CALCIUM 9.2 8.7*  ALT 16  --   ALKPHOS 66  --   BILITOT 0.6  --   PROT 7.9  --   ALBUMIN 4.0  --    Cardiac Labs:  Recent Labs Lab 09/23/15 1124 09/23/15 1131 09/23/15 1619 09/23/15 1902 09/24/15 0111  TROPIPOC  --  0.01  --   --   --   TROPONINI 0.03*  --  0.04* 0.04* 0.04*  BNP 482.9*  --   --   --   --    BG:  Recent Labs Lab 09/23/15 1124  GLUCAP 158*      Medications: Infusions: . nitroGLYCERIN 60 mcg/min (09/24/15 0600)   Scheduled Medications: . aspirin EC  81 mg Oral Daily  . citalopram  20 mg Oral Daily  . diphenhydrAMINE  50 mg Oral QHS  . pantoprazole  40 mg Oral Daily   PRN Medications: acetaminophen, ALPRAZolam, ondansetron (ZOFRAN) IV  Assessment/Plan: Pt is a 82 y.o. yo female with a PMHx of emphysema, CAD, HTN who was admitted on 09/23/2015 with symptoms of CP and SOB, which was determined to be secondary to ACS. Interventions at this time will be focused on supportive/comfort care.   1) CP: Pt w/ h/o CAD. Possible ACS w/ ST elevation on EKG. Trops neg. Hypertensive. Refuses cath. GoC comfort only. - Cardiology c/s: appreciate recs:  - Heparin stopped d/t bleeding from L eye  - transition to PO HTN meds   - increase Carvedilol to 6.25 BID   - increase Losartan to 50mg  qD  - dilaudid 0.2mg  q3h PRN CP  2) Leukocytosis: Downtrending. Likely reactive, no infectious source found.  3) HTN: Resolving. Stop nitro gtt. Increase home med doses as above.  Length of Stay: 1 day(s) Dispo: Anticipated  discharge tomorrow, s/w daughter who was given information for Hospice Care. Will plan to DC home tomorrow if BP and pain controlled on oral medications.  Holley Raring, MD Pager: [phone] (7AM-5PM) 09/24/2015, 6:49 AM

## 2015-09-24 NOTE — Progress Notes (Signed)
Cm consult that pt may want home w hospice. Spoke w da in [employer] that she lives with and 2 sons and da to talk. Left hospice agency list in room. Gave my name and number to da in law sandra for fam to call if they decide on hospice and I will help to arrange. Will cont to follow.

## 2015-09-25 DIAGNOSIS — I160 Hypertensive urgency: Secondary | ICD-10-CM

## 2015-09-25 DIAGNOSIS — I213 ST elevation (STEMI) myocardial infarction of unspecified site: Secondary | ICD-10-CM

## 2015-09-25 DIAGNOSIS — Z66 Do not resuscitate: Secondary | ICD-10-CM

## 2015-09-25 LAB — CBC
HEMATOCRIT: 37.5 % (ref 36.0–46.0)
Hemoglobin: 11.7 g/dL — ABNORMAL LOW (ref 12.0–15.0)
MCH: 25.5 pg — ABNORMAL LOW (ref 26.0–34.0)
MCHC: 31.2 g/dL (ref 30.0–36.0)
MCV: 81.7 fL (ref 78.0–100.0)
PLATELETS: 345 10*3/uL (ref 150–400)
RBC: 4.59 MIL/uL (ref 3.87–5.11)
RDW: 15.8 % — AB (ref 11.5–15.5)
WBC: 16.7 10*3/uL — ABNORMAL HIGH (ref 4.0–10.5)

## 2015-09-25 MED ORDER — LOSARTAN POTASSIUM 50 MG PO TABS
100.0000 mg | ORAL_TABLET | Freq: Every day | ORAL | Status: DC
  Administered 2015-09-25: 100 mg via ORAL
  Filled 2015-09-25: qty 2

## 2015-09-25 MED ORDER — CARVEDILOL 3.125 MG PO TABS: 6.2500 mg | ORAL_TABLET | Freq: Two times a day (BID) | ORAL | 0 refills | Status: AC

## 2015-09-25 MED ORDER — NITROGLYCERIN 0.4 MG/SPRAY TL SOLN: 1.0000 | 12 refills | Status: DC | PRN

## 2015-09-25 MED ORDER — HYDRALAZINE HCL 20 MG/ML IJ SOLN
5.0000 mg | INTRAMUSCULAR | Status: DC | PRN
  Administered 2015-09-25 (×2): 5 mg via INTRAVENOUS
  Filled 2015-09-25 (×2): qty 1

## 2015-09-25 MED ORDER — NITROGLYCERIN 0.4 MG SL SUBL: 0.4000 mg | SUBLINGUAL_TABLET | SUBLINGUAL | 12 refills | Status: AC | PRN

## 2015-09-25 MED ORDER — CLONIDINE HCL 0.1 MG PO TABS
0.3000 mg | ORAL_TABLET | Freq: Two times a day (BID) | ORAL | Status: DC
  Administered 2015-09-25: 0.3 mg via ORAL
  Filled 2015-09-25: qty 3

## 2015-09-25 NOTE — Progress Notes (Signed)
Subjective: Currently, the patient continues to complain of intermittent CP. She is not complaining of SOB and has not required O2 ON. Had some nausea this AM, in the setting of titration IV BP meds.  Interval Events: EKG unchanged.  Objective: Vital signs in last 24 hours: Vitals:   09/25/15 0500 09/25/15 0530 09/25/15 0600 09/25/15 0635  BP:  (!) 138/119 (!) 179/72 (!) 174/155  Pulse: 91 91 93   Resp: 20 (!) 21 17   Temp:      TempSrc:      SpO2: 99% 99% 96%   Weight:      Height:       Intake/Output:  08/21 0701 - 08/22 0700 In: 190.9 [I.V.:190.9] Out: 100 [Urine:100]    Physical Exam: Physical Exam  Constitutional: She appears well-developed. She is cooperative. No distress.  Eyes:  Infiltrating lesion of the left eye, minimal bleeding.  Cardiovascular: Normal rate, regular rhythm, normal heart sounds and normal pulses.  Exam reveals no gallop.   No murmur heard. Pulmonary/Chest: Effort normal and breath sounds normal. No respiratory distress. Breasts are symmetrical.  Abdominal: Soft. Bowel sounds are normal. There is no tenderness.  Musculoskeletal: She exhibits no edema.   Labs: CBC:  Recent Labs Lab 09/23/15 1124 09/24/15 0111 09/25/15 0256  WBC 17.7* 15.9* 16.7*  NEUTROABS 11.7*  --   --   HGB 13.1 11.4* 11.7*  HCT 41.3 36.2 37.5  MCV 81.3 81.7 81.7  PLT 371 [id number]   Metabolic Panel:  Recent Labs Lab 09/23/15 1124 09/24/15 0111  NA 136 134*  K 3.9 3.9  CL 101 100*  CO2 22 22  GLUCOSE 157* 118*  BUN 10 14  CREATININE 0.87 0.95  CALCIUM 9.2 8.7*  ALT 16  --   ALKPHOS 66  --   BILITOT 0.6  --   PROT 7.9  --   ALBUMIN 4.0  --    Cardiac Labs:  Recent Labs Lab 09/23/15 1124 09/23/15 1131 09/23/15 1619 09/23/15 1902 09/24/15 0111  TROPIPOC  --  0.01  --   --   --   TROPONINI 0.03*  --  0.04* 0.04* 0.04*  BNP 482.9*  --   --   --   --    BG:  Recent Labs Lab 09/23/15 1124  GLUCAP 158*    Medications: Infusions:     Scheduled Medications: . aspirin EC  81 mg Oral Daily  . carvedilol  6.25 mg Oral BID WC  . citalopram  20 mg Oral Daily  . diphenhydrAMINE  50 mg Oral QHS  . losartan  50 mg Oral Daily  . pantoprazole  40 mg Oral Daily   PRN Medications: acetaminophen, ALPRAZolam, hydrALAZINE, HYDROmorphone (DILAUDID) injection, labetalol, nitroGLYCERIN, ondansetron (ZOFRAN) IV  Assessment/Plan: Pt is a 82 y.o. yo female with a PMHx of emphysema, CAD, HTN who was admitted on 09/23/2015 with symptoms of CP and SOB, which was determined to be secondary to ACS. Interventions at this time will be focused on supportive/comfort care.   1) CP: Pt w/ h/o CAD. Likely accelerated HTN and angina. Possible ACS w/ ST elevation on EKG. Trops neg. Hypertensive, BP goal loose around 150-160s SBP. Refuses cath. GoC comfort only, maximize ability to go/stay home. - Cardiology c/s: appreciate recs,  - Carvedilol to 6.25 BID, Losartan to 100mg  qD, Clonidine 0.1 BID  - labetolol 5mg  PRN  - dilaudid 0.2mg  q3h PRN CP  2) Nausea: 1 episode of emesis and nausea this AM. May be  secondary to BP med titration. Will treat symptomatically and transition back to PO BP meds.  2) Leukocytosis: Stable. Likely reactive, no infectious source found.  3) HTN: Resolving. Restart PO med doses as above.  Length of Stay: 2 day(s) Dispo: Anticipated discharge today.  Holley Raring, MD Pager: [phone] (7AM-5PM) 09/25/2015, 6:52 AM

## 2015-09-25 NOTE — Discharge Summary (Signed)
Name: Sandra Friedman MRN: [id number] DOB: 11-Dec-1923 82 y.o. PCP: Caryl Bis, MD  Date of Admission: 09/23/2015 11:18 AM Date of Discharge: 09/25/2015 Attending Physician: Annia Belt, MD  Discharge Diagnosis: 1. Accelarated HTN 2. Anginal CP 3. Goals of Care with DNR  Principal Problem:   Accelerated hypertension with diastolic congestive heart failure, NYHA class 3 (HCC) Active Problems:   ST-segment elevation on initial EKG, then converted to LBBB   LBBB (left bundle branch block)   Shortness of breath at rest   Encounter for discussion of do not resuscitate order   ACS (acute coronary syndrome) ([place])  Discharge Medications:   Medication List    TAKE these medications   ALPRAZolam 0.25 MG tablet Commonly known as:  XANAX Take 0.25 mg by mouth at bedtime as needed for anxiety.   aspirin EC 81 MG tablet Take 81 mg by mouth daily.   carvedilol 3.125 MG tablet Commonly known as:  COREG Take 2 tablets (6.25 mg total) by mouth 2 (two) times daily with a meal.   citalopram 20 MG tablet Commonly known as:  CELEXA Take 20 mg by mouth daily.   cloNIDine 0.3 MG tablet Commonly known as:  CATAPRES Take 0.3 mg by mouth 2 (two) times daily.   diphenhydrAMINE 25 mg capsule Commonly known as:  BENADRYL Take 50 mg by mouth at bedtime.   HYDROmorphone 2 MG tablet Commonly known as:  DILAUDID Take 0.5 tablets (1 mg total) by mouth every 4 (four) hours as needed for severe pain.   ibuprofen 200 MG tablet Commonly known as:  ADVIL,MOTRIN Take 400 mg by mouth at bedtime.   losartan 100 MG tablet Commonly known as:  COZAAR Take 100 mg by mouth daily.   nitroGLYCERIN 0.4 MG/SPRAY spray Commonly known as:  NITROLINGUAL Place 1 spray under the tongue every 5 (five) minutes x 3 doses as needed for chest pain.   omeprazole 20 MG capsule Commonly known as:  PRILOSEC Take 20 mg by mouth 2 (two) times daily.   raloxifene 60 MG tablet Commonly known as:   EVISTA Take 60 mg by mouth daily.      Disposition and follow-up:   [name] was discharged from [facility] in Stable condition.  At the hospital follow up visit please address:  1.  CP: Symptoms controlled? BP management? GoC: Hospice involvement? MOST form completed to stipulate scope of treatment?  Follow-up Appointments: Follow-up Information    [facility] .   Contact information: [address] [place] [address] [phone]        Gar Ponto, MD. Schedule an appointment as soon as possible for a visit in 2 week(s).   Specialty:  Family Medicine Contact information: [address] [place] [address] [facility] Course by problem list: Principal Problem:   Accelerated hypertension with diastolic congestive heart failure, NYHA class 3 (HCC) Active Problems:   ST-segment elevation on initial EKG, then converted to LBBB   LBBB (left bundle branch block)   Shortness of breath at rest   Encounter for discussion of do not resuscitate order   ACS (acute coronary syndrome) ([place])   1. CP: Patient presented with acute onset of CP and SOB. She had a prehospital EKG concerning for STEMI which converted to LBBB on arrival of ED. She clearly stated her goals of care were for comfort to maximize chances of returning to home  and staying there. Cardiac Catheterization was refused. Troponins were negative and CP was determined to be likely secondary to angina and accelerated HTN. TTE demonstrated good cardiac EF of 0000000 and mild diastolic dysfunction. CXR/exam showed no signs of fluid overload. She was treated with nitroglycerin, narcotics, and BP control and her symptoms began to improve. However, she still complained of intermittent CP. We explained that we may not be able to fully treat her symptoms without procedural intervention, and the patient understood and was agreeable to the plan of supportive therapy at home.  Family was given resources to explore home hospice in light of the patient's state GoC, and her high risk of serious MI given her known CAD and current symptoms. 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 This may help her to qualify for hospice services, from which I think she would benefit. She does not want any medical treatment for her cancer.  2. HTN: Pt was hypertensive on admission in the setting of having missed her home meds for the day. Likely some component rebound HTN from missing home clonidine. Treated with nitro gtt and then transitions back to home PO meds. Coreg 6.25 was added, and home losartan and clonidine were restarted. Per Cardiology, would recommend considering amlodipine +/- imdur to avoid rebound HTN. Will continue with previous home meds at discharge and reevaluate HTN regimen with outpt follow-up.   Discharge Vitals:   BP (!) 180/56   Pulse 85   Temp 98.3 F (36.8 C) (Oral)   Resp 19   Ht 4\' 11"  (1.499 m)   Wt 128 lb 14.5 oz (58.5 kg)   SpO2 97%   BMI 26.04 kg/m   Pertinent Labs, Studies, and Procedures: Troponin: Negative x3  Procedures Performed:  Dg Chest Port 1 View  Result Date: 09/23/2015 CLINICAL DATA:  Shortness of breath. EXAM: PORTABLE CHEST 1 VIEW COMPARISON:  None. FINDINGS: The heart is mildly enlarged. There is no evidence of pulmonary edema, consolidation, pneumothorax, nodule or pleural fluid. Visualized bony structures are unremarkable. IMPRESSION: Mild cardiac enlargement.  No active disease. Electronically Signed   By: Aletta Edouard M.D.   On: 09/23/2015 12:09   2D Echo:  Impressions: - LVEF 50-55%, mild LVH, incoordinate septal motion, aortic valve   sclerosis with trivial AI, mild MR, moderate LAE, normal RV size   with reduced systolic function, trivial TR, normal RVSP, normal   IVC.  Consultations: Treatment Team:  Rounding [facility]iology,  MD  Discharge Instructions: Discharge Instructions    Diet general    Complete by:  As directed   Discharge instructions    Complete by:  As directed   Your chest pain is a result of poor blood flow to the heart. We can treat your symptoms with medication, but we will not be able to fix the problem with out procedural intervention. You have decided to pursue comfort care which means that we will treat your symptoms with medications in order to maximize your chances of returning home and being comfortable.   Increase activity slowly    Complete by:  As directed      Signed: Holley Raring, MD 09/25/2015, 9:25 AM   Pager: [phone]

## 2015-09-25 NOTE — Progress Notes (Signed)
Goals of Care discussion with patient and family. Completed a MOST form and scanned into medical record. Pt and family to pursue comfort care with goal of staying at home and not returning to the hospital. Would like all treatments which maximize time at home and time with family.

## 2015-09-25 NOTE — Progress Notes (Signed)
   Pt presented with what amounts to be Accelerated HTN with some HF Sx & assoicated CP.  EKG was confusing as ? New LBBB.  She ruled out for MI & Echo was relatively normal.  Agree with HTN management - would probably choose a different medication than Clonidine to avoid rebound HTN.  Added Coreg here - ? Consider Amlodipine +/- Imdur.  At this point, I do not think, with her advanced age / cancer / etc, that she would benefit from Cardiology f/u as an OP. She quite likely has CAD - but ruled out for MI.  I suspect that her CP may be related to her HTN.  Per primary team, she looks to be for d/c today.    Cardiology will sign off.  Glenetta Hew, MD

## 2015-09-25 NOTE — Progress Notes (Signed)
Met w sons , patient, md, went over hospice agency list in [place]. They have chosen [facility]. [facility] eq w [facility] and have req home oxyegn but md states does not need for transport. fam to transport by car.most form filled out by md and fam has.

## 2015-11-16 DIAGNOSIS — Z7401 Bed confinement status: Secondary | ICD-10-CM | POA: Diagnosis not present

## 2015-11-16 DIAGNOSIS — R279 Unspecified lack of coordination: Secondary | ICD-10-CM | POA: Diagnosis not present

## 2015-12-05 DEATH — deceased

## 2017-12-17 IMAGING — DX DG CHEST 1V PORT
1 series · 1 of 1 positions shown · non-contrast
Comparison: None.

CLINICAL DATA: Shortness of breath.

EXAM:
PORTABLE CHEST 1 VIEW

[chest ap]
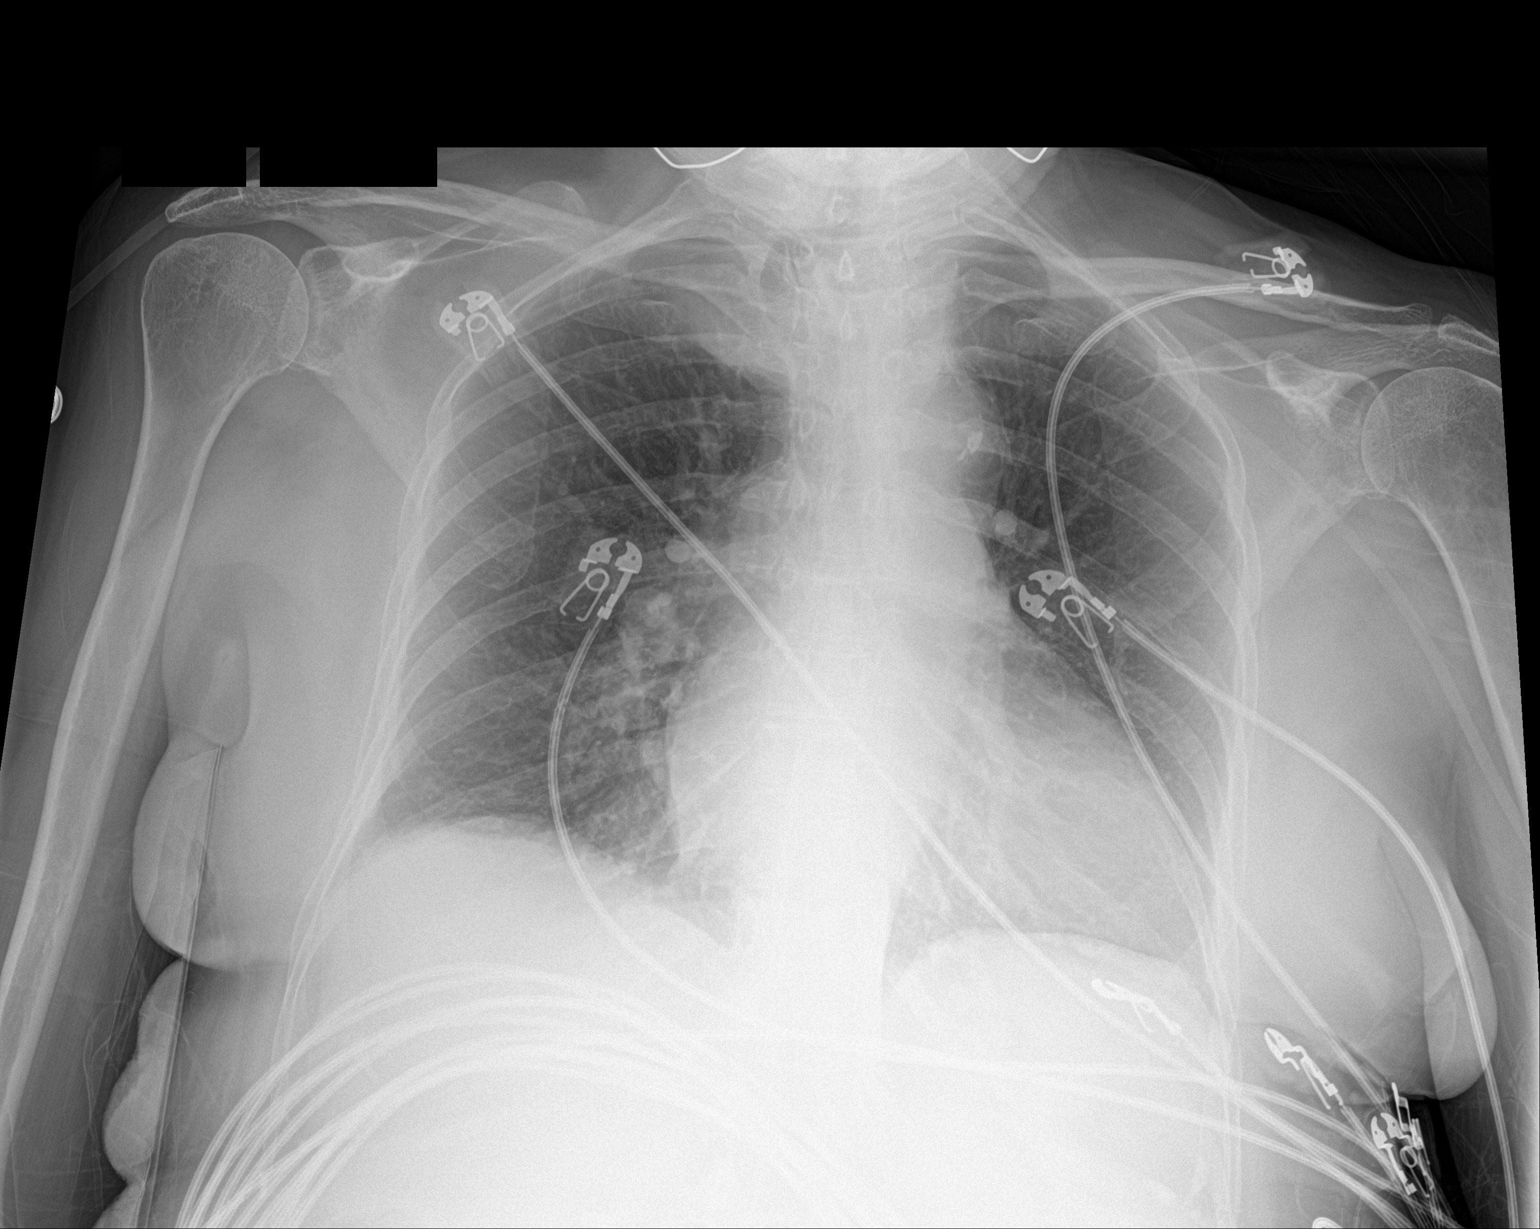

[1 of 1 positions shown; findings below may reference images not displayed]

FINDINGS: The heart is mildly enlarged. There is no evidence of pulmonary
edema, consolidation, pneumothorax, nodule or pleural fluid.
Visualized bony structures are unremarkable.
IMPRESSION: Mild cardiac enlargement.  No active disease.
